# Patient Record
Sex: Female | Born: 2000
Health system: Southern US, Community
[De-identification: ages and names within clinical notes are randomized; demographics above are authoritative.]

---
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
## 2021-03-19 ENCOUNTER — Other Ambulatory Visit (HOSPITAL_COMMUNITY)
Admission: RE | Admit: 2021-03-19 | Discharge: 2021-03-19 | Disposition: A | Discharge status: Home / Self Care | Payer: BC Managed Care – PPO | Source: Ambulatory Visit | Attending: Obstetrics & Gynecology | Admitting: Obstetrics & Gynecology

## 2021-03-19 ENCOUNTER — Other Ambulatory Visit: Payer: Self-pay

## 2021-03-19 ENCOUNTER — Ambulatory Visit (INDEPENDENT_AMBULATORY_CARE_PROVIDER_SITE_OTHER): Payer: BC Managed Care – PPO | Admitting: Obstetrics & Gynecology

## 2021-03-19 ENCOUNTER — Encounter (HOSPITAL_BASED_OUTPATIENT_CLINIC_OR_DEPARTMENT_OTHER): Payer: Self-pay | Admitting: Obstetrics & Gynecology

## 2021-03-19 VITALS — BP 139/81 | HR 52 | Ht 67.75 in | Wt 178.0 lb

## 2021-03-19 DIAGNOSIS — Z30011 Encounter for initial prescription of contraceptive pills: Secondary | ICD-10-CM | POA: Diagnosis not present

## 2021-03-19 DIAGNOSIS — N906 Unspecified hypertrophy of vulva: Secondary | ICD-10-CM | POA: Diagnosis not present

## 2021-03-19 DIAGNOSIS — Z01419 Encounter for gynecological examination (general) (routine) without abnormal findings: Secondary | ICD-10-CM | POA: Diagnosis not present

## 2021-03-19 DIAGNOSIS — Z113 Encounter for screening for infections with a predominantly sexual mode of transmission: Secondary | ICD-10-CM | POA: Diagnosis not present

## 2021-03-19 MED ORDER — NORETHINDRONE ACET-ETHINYL EST 1-20 MG-MCG PO TABS: 1.0000 | ORAL_TABLET | Freq: Every day | ORAL | 1 refills | Status: DC

## 2021-03-19 NOTE — Progress Notes (Signed)
20 y.o. G0P0000 Single White or Caucasian female here for annual exam.  Cycles are regular.  Flow lasts about 6-7 days.  Heaviest days are the first three to four days with the final few being lighter.  Has been SA in the last year, not now.  Desires contraception.  Options discussed including long acting options.  Pt desires to start pills.  Going to [employer] in the fall.  Really excited.  Has roommate.    Patient's last menstrual period was 02/24/2021.          Sexually active: not currently The current method of family planning is none.    Exercising: Yes.     Smoker:  no   reports that she is a non-smoker but has been exposed to tobacco smoke. She has never used smokeless tobacco. She reports that she does not drink alcohol and does not use drugs.  History reviewed. No pertinent past medical history.  History reviewed. No pertinent surgical history.  Current Outpatient Medications  Medication Sig Dispense Refill  . ibuprofen (ADVIL,MOTRIN) 200 MG tablet Take 200 mg by mouth every 8 (eight) hours as needed.     No current facility-administered medications for this visit.    Family History  Problem Relation Age of Onset  . Hypertension Mother   . Diabetes Father   . Thyroid cancer Paternal Grandfather   . Heart disease Maternal Grandfather        heart stent    Review of Systems  All other systems reviewed and are negative.   Exam:   BP 139/81   Pulse (!) 52   Ht 5' 7.75" (1.721 m)   Wt 178 lb (80.7 kg)   LMP 02/24/2021   BMI 27.26 kg/m   Height: 5' 7.75" (172.1 cm)  General appearance: alert, cooperative and appears stated age Head: Normocephalic, without obvious abnormality, atraumatic Neck: no adenopathy, supple, symmetrical, trachea midline and thyroid normal to inspection and palpation Lungs: clear to auscultation bilaterally Breasts: normal appearance, no masses or tenderness Heart: regular rate and rhythm Abdomen: soft, non-tender; bowel sounds normal; no  masses,  no organomegaly Extremities: extremities normal, atraumatic, no cyanosis or edema Skin: Skin color, texture, turgor normal. No rashes or lesions Lymph nodes: Cervical, supraclavicular, and axillary nodes normal. No abnormal inguinal nodes palpated Neurologic: Grossly normal   Pelvic: External genitalia:  no lesions, excessive labia minora tissue              Urethra:  normal appearing urethra with no masses, tenderness or lesions              Bartholins and Skenes: normal                 Vagina: normal appearing vagina with normal color and no discharge, no lesions              Cervix: no lesions              Pap taken: No. Bimanual Exam:  Uterus:  normal size, contour, position, consistency, mobility, non-tender              Adnexa: normal adnexa and no mass, fullness, tenderness  Chaperone, Ina Homes, CMA, was present for exam.  Assessment/Plan: 1. Well woman exam with routine gynecological exam - pap smear starting age 26 discussed - breast cancer screening guidelines reviewed  2. Screen for STD (sexually transmitted disease) - Cervicovaginal ancillary only( [place])  3. Encounter for initial prescription of contraceptive pills - Risks  discussed with pt in detail including DUB, DVT/PE, headache, nausea, increased BP.  Instructions for starting OCPs and what to do if/when misses pills reviewed.   - norethindrone-ethinyl estradiol (LOESTRIN) 1-20 MG-MCG tablet; Take 1 tablet by mouth daily.  Dispense: 84 tablet; Refill: 1 - recheck BP in 3 months  4. Labial hypertrophy - pt still considering labial reduction but not right now

## 2021-03-20 LAB — CERVICOVAGINAL ANCILLARY ONLY
Chlamydia: NEGATIVE
Comment: NEGATIVE
Comment: NORMAL
Neisseria Gonorrhea: NEGATIVE

## 2021-03-21 DIAGNOSIS — Z30011 Encounter for initial prescription of contraceptive pills: Secondary | ICD-10-CM | POA: Insufficient documentation

## 2021-06-06 ENCOUNTER — Telehealth (HOSPITAL_BASED_OUTPATIENT_CLINIC_OR_DEPARTMENT_OTHER): Payer: Self-pay | Admitting: Obstetrics & Gynecology

## 2021-06-06 NOTE — Telephone Encounter (Signed)
Called patient and I  was unable to leave a message voice was full. I called the mom and left  a message to call the office back to get on the  schedule for  a nurse visit for her daughter per [place].

## 2021-06-07 NOTE — Telephone Encounter (Signed)
Patient has an appointment to be seen 06/10/2021 @4 :45. tbw

## 2021-06-10 ENCOUNTER — Encounter (HOSPITAL_BASED_OUTPATIENT_CLINIC_OR_DEPARTMENT_OTHER): Payer: Self-pay | Admitting: Obstetrics & Gynecology

## 2021-06-10 ENCOUNTER — Ambulatory Visit (HOSPITAL_BASED_OUTPATIENT_CLINIC_OR_DEPARTMENT_OTHER): Payer: BC Managed Care – PPO | Admitting: Obstetrics & Gynecology

## 2021-06-10 ENCOUNTER — Other Ambulatory Visit: Payer: Self-pay

## 2021-06-10 VITALS — BP 141/75 | HR 66 | Ht 68.0 in | Wt 182.2 lb

## 2021-06-10 DIAGNOSIS — Z30011 Encounter for initial prescription of contraceptive pills: Secondary | ICD-10-CM

## 2021-06-10 DIAGNOSIS — Z3041 Encounter for surveillance of contraceptive pills: Secondary | ICD-10-CM | POA: Diagnosis not present

## 2021-06-10 DIAGNOSIS — N946 Dysmenorrhea, unspecified: Secondary | ICD-10-CM | POA: Diagnosis not present

## 2021-06-10 MED ORDER — NORETHINDRONE ACET-ETHINYL EST 1-20 MG-MCG PO TABS
1.0000 | ORAL_TABLET | Freq: Every day | ORAL | 1 refills | Status: DC
Start: 2021-06-10 — End: 2022-04-28

## 2021-06-10 NOTE — Progress Notes (Signed)
GYNECOLOGY  VISIT  CC:   OCP recheck  HPI: 20 y.o. G0P0000 Single White or Caucasian female here for recheck after starting OCPs for dysmenorrhea and desired contraception.  Pt reports she is doing really well.  Cycles are 4 days and much lighter.  Cramping is much improved.  Denies headache, nausea or visual changes.  Hasn't missed pills.  Sets phone reminders and this has helped.  Reviewed Plan B and back up method use for 7 days if misses a pill.  Pt voiced understanding.  Goes to college on Friday.  Wished her well.    GYNECOLOGIC HISTORY: Patient's last menstrual period was 05/22/2021.  Patient Active Problem List   Diagnosis Date Noted   Encounter for initial prescription of contraceptive pills 03/21/2021   Labial hypertrophy 08/29/2015    No past medical history on file.  No past surgical history on file.  MEDS:   Current Outpatient Medications on File Prior to Visit  Medication Sig Dispense Refill   ibuprofen (ADVIL,MOTRIN) 200 MG tablet Take 200 mg by mouth every 8 (eight) hours as needed.     norethindrone-ethinyl estradiol (LOESTRIN) 1-20 MG-MCG tablet Take 1 tablet by mouth daily. 84 tablet 1   No current facility-administered medications on file prior to visit.    ALLERGIES: Patient has no known allergies.  Family History  Problem Relation Age of Onset   Hypertension Mother    Diabetes Father    Thyroid cancer Paternal Grandfather    Heart disease Maternal Grandfather        heart stent    SH:  single, non smoker  Review of Systems  All other systems reviewed and are negative.  PHYSICAL EXAMINATION:    BP (!) 141/75 (BP Location: Right Arm, Patient Position: Sitting, Cuff Size: Large)   Pulse 66   Ht 5\' 8"  (1.727 m) Comment: reported  Wt 182 lb 3.2 oz (82.6 kg)   LMP 05/22/2021   BMI 27.70 kg/m     General appearance: alert, cooperative and appears stated age CV:  Regular rate and rhythm Lungs:  clear to auscultation, no wheezes, rales or rhonchi,  symmetric air entry  Assessment/Plan: 1. Encounter for surveillance of contraceptive pills - norethindrone-ethinyl estradiol (LOESTRIN) 1-20 MG-MCG tablet; Take 1 tablet by mouth daily.  Dispense: 84 tablet; Refill: 1  2. Dysmenorrhea - improved.   - recheck next summer for wellness exam

## 2021-06-11 DIAGNOSIS — Z3041 Encounter for surveillance of contraceptive pills: Secondary | ICD-10-CM | POA: Insufficient documentation

## 2021-06-15 ENCOUNTER — Encounter (HOSPITAL_BASED_OUTPATIENT_CLINIC_OR_DEPARTMENT_OTHER): Payer: Self-pay

## 2022-04-28 ENCOUNTER — Encounter (HOSPITAL_BASED_OUTPATIENT_CLINIC_OR_DEPARTMENT_OTHER): Payer: Self-pay | Admitting: Obstetrics & Gynecology

## 2022-04-28 ENCOUNTER — Other Ambulatory Visit (HOSPITAL_BASED_OUTPATIENT_CLINIC_OR_DEPARTMENT_OTHER): Payer: Self-pay | Admitting: *Deleted

## 2022-04-28 DIAGNOSIS — Z3041 Encounter for surveillance of contraceptive pills: Secondary | ICD-10-CM

## 2022-04-28 MED ORDER — NORETHINDRONE ACET-ETHINYL EST 1-20 MG-MCG PO TABS: 1.0000 | ORAL_TABLET | Freq: Every day | ORAL | 0 refills | Status: DC

## 2022-05-22 ENCOUNTER — Other Ambulatory Visit (HOSPITAL_BASED_OUTPATIENT_CLINIC_OR_DEPARTMENT_OTHER): Payer: Self-pay | Admitting: Obstetrics & Gynecology

## 2022-05-22 ENCOUNTER — Encounter (HOSPITAL_BASED_OUTPATIENT_CLINIC_OR_DEPARTMENT_OTHER): Payer: Self-pay | Admitting: Obstetrics & Gynecology

## 2022-05-22 ENCOUNTER — Other Ambulatory Visit (HOSPITAL_COMMUNITY)
Admission: RE | Admit: 2022-05-22 | Discharge: 2022-05-22 | Disposition: A | Discharge status: Home / Self Care | Payer: BC Managed Care – PPO | Source: Ambulatory Visit | Attending: Obstetrics & Gynecology | Admitting: Obstetrics & Gynecology

## 2022-05-22 ENCOUNTER — Ambulatory Visit (INDEPENDENT_AMBULATORY_CARE_PROVIDER_SITE_OTHER): Payer: BC Managed Care – PPO | Admitting: Obstetrics & Gynecology

## 2022-05-22 VITALS — BP 137/69 | HR 51 | Ht 67.5 in | Wt 182.2 lb

## 2022-05-22 DIAGNOSIS — Z0000 Encounter for general adult medical examination without abnormal findings: Secondary | ICD-10-CM | POA: Diagnosis not present

## 2022-05-22 DIAGNOSIS — Z113 Encounter for screening for infections with a predominantly sexual mode of transmission: Secondary | ICD-10-CM

## 2022-05-22 DIAGNOSIS — Z3041 Encounter for surveillance of contraceptive pills: Secondary | ICD-10-CM | POA: Diagnosis not present

## 2022-05-22 NOTE — Progress Notes (Signed)
21 y.o. G0P0000 Single White or Caucasian female here for annual exam.  She is on OCPs.  Flow last 4-5 days.  Two days are heavy.  Flow used to be much heavier.  Considering IUD.  Would like to discuss options.  She is SA.  Will obtain STD testing today.  Patient's last menstrual period was 05/12/2022 (approximate).          Sexually active: Yes.    The current method of family planning is OCP (estrogen/progesterone).     Health Maintenance: Pap:  not indicated MMG:  screening guidelines reviewed   reports that she has never smoked. She has never used smokeless tobacco. She reports that she does not drink alcohol and does not use drugs.  History reviewed. No pertinent past medical history.  History reviewed. No pertinent surgical history.  Current Outpatient Medications  Medication Sig Dispense Refill   ibuprofen (ADVIL,MOTRIN) 200 MG tablet Take 200 mg by mouth every 8 (eight) hours as needed.     norethindrone-ethinyl estradiol (LOESTRIN) 1-20 MG-MCG tablet Take 1 tablet by mouth daily. 84 tablet 0   No current facility-administered medications for this visit.    Family History  Problem Relation Age of Onset   Hypertension Mother    Diabetes Father    Thyroid disease Sister 41       Hashimoto's thyroiditis   Heart disease Maternal Grandfather        heart stent   Thyroid cancer Paternal Grandfather     A comprehensive review of systems was negative.  Exam:   BP 137/69 (BP Location: Left Arm, Patient Position: Sitting, Cuff Size: Large)   Pulse (!) 51   Ht 5' 7.5" (1.715 m) Comment: reported  Wt 182 lb 3.2 oz (82.6 kg)   LMP 05/12/2022 (Approximate)   BMI 28.12 kg/m   Height: 5' 7.5" (171.5 cm) (reported)  General appearance: alert, cooperative and appears stated age Head: Normocephalic, without obvious abnormality, atraumatic Neck: no adenopathy, supple, symmetrical Lungs: clear to auscultation bilaterally Heart: regular rate and rhythm Neurologic: Grossly  normal Pelvic exam not performed.  Assessment/Plan: 1. Well woman exam without gynecological exam - pap smear guidelines with starting pap at age 39 discussed - breast cancer screening guidelines reviewed   2. Routine screening for STI (sexually transmitted infection) - Cervicovaginal ancillary only( [place])  3. Encounter for surveillance of contraceptive pills - pt does not need RF.  She is going ot plan to return for IUD placement.  She will call at onset of next cycle.

## 2022-05-23 LAB — CERVICOVAGINAL ANCILLARY ONLY
Chlamydia: NEGATIVE
Comment: NEGATIVE
Comment: NORMAL
Neisseria Gonorrhea: NEGATIVE

## 2022-06-10 ENCOUNTER — Encounter (HOSPITAL_BASED_OUTPATIENT_CLINIC_OR_DEPARTMENT_OTHER): Payer: Self-pay | Admitting: Obstetrics & Gynecology

## 2022-07-04 ENCOUNTER — Ambulatory Visit (HOSPITAL_BASED_OUTPATIENT_CLINIC_OR_DEPARTMENT_OTHER): Payer: BC Managed Care – PPO | Admitting: Obstetrics & Gynecology

## 2022-07-04 ENCOUNTER — Encounter (HOSPITAL_BASED_OUTPATIENT_CLINIC_OR_DEPARTMENT_OTHER): Payer: Self-pay | Admitting: Obstetrics & Gynecology

## 2022-07-04 VITALS — BP 127/81 | HR 48 | Ht 68.0 in | Wt 186.8 lb

## 2022-07-04 DIAGNOSIS — Z30430 Encounter for insertion of intrauterine contraceptive device: Secondary | ICD-10-CM | POA: Diagnosis not present

## 2022-07-04 DIAGNOSIS — Z975 Presence of (intrauterine) contraceptive device: Secondary | ICD-10-CM

## 2022-07-04 MED ORDER — LEVONORGESTREL 19.5 MG IU IUD: INTRAUTERINE_SYSTEM | Freq: Once | INTRAUTERINE | Status: AC

## 2022-07-04 NOTE — Progress Notes (Unsigned)
21 y.o. G0P0000 {MARITAL STATUS:22092} {Race/ethnicity:17218} female presents for insertion of ***.  Pt has been counseled about alternative forms of contraception including OCPs, progesterone options, sterilization procedures ***, condoms, and natural family planning.  She feels IUD is the better option for her.  Pt has also been counseled about risks and benefits as well as complications.  Consent is obtained today.  All questions answered prior to start of procedure.    Current contraception: *** Last STD testing:  *** LMP:  Patient's last menstrual period was 07/04/2022.  Patient Active Problem List   Diagnosis Date Noted   Encounter for surveillance of contraceptive pills 06/11/2021   Labial hypertrophy 08/29/2015   No past medical history on file. Current Outpatient Medications on File Prior to Visit  Medication Sig Dispense Refill   ibuprofen (ADVIL,MOTRIN) 200 MG tablet Take 200 mg by mouth every 8 (eight) hours as needed.     norethindrone-ethinyl estradiol (LOESTRIN) 1-20 MG-MCG tablet Take 1 tablet by mouth daily. (Patient not taking: Reported on 07/04/2022) 84 tablet 0   No current facility-administered medications on file prior to visit.   Patient has no known allergies.  ROS Vitals:   07/04/22 0929  BP: 127/81  Pulse: (!) 48  Weight: 186 lb 12.8 oz (84.7 kg)  Height: 5\' 8"  (1.727 m)    Gen:  WNWF healthy female NAD Abdomen: soft, non-tender Groin:  {Exam; lymph nodes inguinal:30852}  Pelvic exam: Vulva:  normal female genitalia Vagina:  {exam; vagina:12200} Cervix:  Non-tender, Negative CMT, no lesions or redness. Uterus:  {Exam; uterus:16386}   Procedure:  Speculum reinserted.  Cervix visualized and cleansed with Betadine x 3.  Paracervical block {WAS/WAS NOT:[phone]::"was not"} placed.  Single toothed tenaculum applied to anterior lip of cervix without difficulty.  1% Lidocaine without epinephrine was used.  ***cc total instilled at 3 and 9 o'clock positions  on cervix.  Uterus sounded to {NUMBER 1-10:22536}cm.  Lot number: ***.  Expiration:  ***.  IUD package was opened.  IUD and introducer passed to fundus and then withdrawn slightly before IUD was passed into endometrial cavity.  Introducer removed.  Strings cut to 2cm.  Tenaculum removed from cervix.  Minimal bleeding noted.  Pt tolerated the procedure well.  All instruments removed from vagina.  Assessment/Plan:  - Return for recheck 6-8 weeks - Pt aware to call for any concerns - Pt aware removal due no later than ***.  IUD card given to pt.

## 2022-07-08 DIAGNOSIS — Z975 Presence of (intrauterine) contraceptive device: Secondary | ICD-10-CM | POA: Insufficient documentation

## 2022-07-13 ENCOUNTER — Other Ambulatory Visit (HOSPITAL_BASED_OUTPATIENT_CLINIC_OR_DEPARTMENT_OTHER): Payer: Self-pay | Admitting: Obstetrics & Gynecology

## 2022-07-13 DIAGNOSIS — Z3041 Encounter for surveillance of contraceptive pills: Secondary | ICD-10-CM

## 2022-09-10 ENCOUNTER — Ambulatory Visit (HOSPITAL_BASED_OUTPATIENT_CLINIC_OR_DEPARTMENT_OTHER): Payer: BC Managed Care – PPO | Admitting: Obstetrics & Gynecology

## 2022-09-11 ENCOUNTER — Ambulatory Visit (HOSPITAL_BASED_OUTPATIENT_CLINIC_OR_DEPARTMENT_OTHER): Payer: BC Managed Care – PPO | Admitting: Obstetrics & Gynecology

## 2022-12-04 DEATH — deceased
